# Patient Record
Sex: Male | Born: 1990 | Race: White | Hispanic: No | Marital: Single | Smoking: Never smoker
Health system: Southern US, Community
[De-identification: ages and names within clinical notes are randomized; demographics above are authoritative.]

## PROBLEM LIST (undated history)

## (undated) HISTORY — PX: HERNIA REPAIR: SHX51

---

## 2013-12-16 ENCOUNTER — Emergency Department (HOSPITAL_COMMUNITY)
Admission: EM | Admit: 2013-12-16 | Discharge: 2013-12-16 | Disposition: A | Discharge status: Home / Self Care | Payer: Self-pay | Attending: Emergency Medicine | Admitting: Emergency Medicine

## 2013-12-16 ENCOUNTER — Emergency Department (HOSPITAL_COMMUNITY): Payer: No Typology Code available for payment source

## 2013-12-16 ENCOUNTER — Emergency Department (HOSPITAL_COMMUNITY): Payer: Self-pay

## 2013-12-16 ENCOUNTER — Encounter (HOSPITAL_COMMUNITY): Payer: Self-pay | Admitting: Emergency Medicine

## 2013-12-16 DIAGNOSIS — S99919A Unspecified injury of unspecified ankle, initial encounter: Secondary | ICD-10-CM

## 2013-12-16 DIAGNOSIS — M25561 Pain in right knee: Secondary | ICD-10-CM

## 2013-12-16 DIAGNOSIS — S298XXA Other specified injuries of thorax, initial encounter: Secondary | ICD-10-CM | POA: Insufficient documentation

## 2013-12-16 DIAGNOSIS — IMO0002 Reserved for concepts with insufficient information to code with codable children: Secondary | ICD-10-CM | POA: Insufficient documentation

## 2013-12-16 DIAGNOSIS — S8990XA Unspecified injury of unspecified lower leg, initial encounter: Secondary | ICD-10-CM | POA: Insufficient documentation

## 2013-12-16 DIAGNOSIS — S99929A Unspecified injury of unspecified foot, initial encounter: Secondary | ICD-10-CM

## 2013-12-16 DIAGNOSIS — R079 Chest pain, unspecified: Secondary | ICD-10-CM

## 2013-12-16 DIAGNOSIS — R110 Nausea: Secondary | ICD-10-CM | POA: Insufficient documentation

## 2013-12-16 DIAGNOSIS — Y92410 Unspecified street and highway as the place of occurrence of the external cause: Secondary | ICD-10-CM | POA: Insufficient documentation

## 2013-12-16 DIAGNOSIS — S0990XA Unspecified injury of head, initial encounter: Secondary | ICD-10-CM | POA: Insufficient documentation

## 2013-12-16 DIAGNOSIS — Y9389 Activity, other specified: Secondary | ICD-10-CM | POA: Insufficient documentation

## 2013-12-16 DIAGNOSIS — T148XXA Other injury of unspecified body region, initial encounter: Secondary | ICD-10-CM

## 2013-12-16 DIAGNOSIS — S0510XA Contusion of eyeball and orbital tissues, unspecified eye, initial encounter: Secondary | ICD-10-CM | POA: Insufficient documentation

## 2013-12-16 LAB — I-STAT CHEM 8, ED
BUN: 9 mg/dL (ref 6–23)
BUN: 9 mg/dL (ref 6–23)
CALCIUM ION: 1.18 mmol/L (ref 1.12–1.23)
CREATININE: 1.1 mg/dL (ref 0.50–1.35)
Calcium, Ion: 1.15 mmol/L (ref 1.12–1.23)
Chloride: 104 mEq/L (ref 96–112)
Chloride: 103 mEq/L (ref 96–112)
Creatinine, Ser: 1.2 mg/dL (ref 0.50–1.35)
GLUCOSE: 106 mg/dL — AB (ref 70–99)
Glucose, Bld: 112 mg/dL — ABNORMAL HIGH (ref 70–99)
HEMATOCRIT: 48 % (ref 39.0–52.0)
HEMATOCRIT: 49 % (ref 39.0–52.0)
HEMOGLOBIN: 16.3 g/dL (ref 13.0–17.0)
HEMOGLOBIN: 16.7 g/dL (ref 13.0–17.0)
Potassium: 3.5 mEq/L — ABNORMAL LOW (ref 3.7–5.3)
Potassium: 3.5 mEq/L — ABNORMAL LOW (ref 3.7–5.3)
Sodium: 139 mEq/L (ref 137–147)
Sodium: 140 mEq/L (ref 137–147)
TCO2: 24 mmol/L (ref 0–100)
TCO2: 25 mmol/L (ref 0–100)

## 2013-12-16 MED ORDER — FLUORESCEIN SODIUM 1 MG OP STRP
1.0000 | ORAL_STRIP | Freq: Once | OPHTHALMIC | Status: AC
  Administered 2013-12-16: 1 via OPHTHALMIC
  Filled 2013-12-16: qty 1

## 2013-12-16 MED ORDER — TETRACAINE HCL 0.5 % OP SOLN
1.0000 [drp] | Freq: Once | OPHTHALMIC | Status: AC
  Administered 2013-12-16: 1 [drp] via OPHTHALMIC
  Filled 2013-12-16: qty 2

## 2013-12-16 MED ORDER — MORPHINE SULFATE 4 MG/ML IJ SOLN
4.0000 mg | Freq: Once | INTRAMUSCULAR | Status: AC
  Administered 2013-12-16: 4 mg via INTRAVENOUS
  Filled 2013-12-16: qty 1

## 2013-12-16 MED ORDER — OXYCODONE-ACETAMINOPHEN 5-325 MG PO TABS
1.0000 | ORAL_TABLET | Freq: Four times a day (QID) | ORAL | Status: DC | PRN
Start: 2013-12-16 — End: 2013-12-16

## 2013-12-16 MED ORDER — IOHEXOL 300 MG/ML  SOLN
100.0000 mL | Freq: Once | INTRAMUSCULAR | Status: AC | PRN
  Administered 2013-12-16: 100 mL via INTRAVENOUS

## 2013-12-16 MED ORDER — ONDANSETRON HCL 4 MG/2ML IJ SOLN
4.0000 mg | Freq: Once | INTRAMUSCULAR | Status: DC
  Filled 2013-12-16: qty 2

## 2013-12-16 MED ORDER — ONDANSETRON HCL 4 MG PO TABS: 4.0000 mg | ORAL_TABLET | Freq: Four times a day (QID) | ORAL | Status: AC

## 2013-12-16 MED ORDER — ONDANSETRON HCL 4 MG/2ML IJ SOLN
4.0000 mg | Freq: Once | INTRAMUSCULAR | Status: AC
  Administered 2013-12-16: 4 mg via INTRAVENOUS
  Filled 2013-12-16: qty 2

## 2013-12-16 MED ORDER — OXYCODONE-ACETAMINOPHEN 5-325 MG PO TABS: 1.0000 | ORAL_TABLET | Freq: Four times a day (QID) | ORAL | Status: AC | PRN

## 2013-12-16 MED ORDER — ONDANSETRON HCL 4 MG PO TABS: 4.0000 mg | ORAL_TABLET | Freq: Four times a day (QID) | ORAL | Status: DC
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
## 2013-12-16 NOTE — ED Notes (Signed)
Patient transported to X-ray 
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
## 2013-12-17 NOTE — ED Provider Notes (Signed)
Medical screening examination/treatment/procedure(s) were conducted as a shared visit with non-physician practitioner(s) and myself.  I personally evaluated the patient during the encounter.  Please see separate associated note for evaluation and plan.    EKG Interpretation None       Gilda Crease, MD 12/17/13 [phone]

## 2015-10-19 ENCOUNTER — Emergency Department (HOSPITAL_COMMUNITY): Payer: BLUE CROSS/BLUE SHIELD

## 2015-10-19 ENCOUNTER — Emergency Department (HOSPITAL_COMMUNITY)
Admission: EM | Admit: 2015-10-19 | Discharge: 2015-10-19 | Disposition: A | Discharge status: Home / Self Care | Payer: BLUE CROSS/BLUE SHIELD | Attending: Emergency Medicine | Admitting: Emergency Medicine

## 2015-10-19 ENCOUNTER — Encounter (HOSPITAL_COMMUNITY): Payer: Self-pay | Admitting: Emergency Medicine

## 2015-10-19 DIAGNOSIS — M545 Low back pain, unspecified: Secondary | ICD-10-CM

## 2015-10-19 DIAGNOSIS — S32000A Wedge compression fracture of unspecified lumbar vertebra, initial encounter for closed fracture: Secondary | ICD-10-CM

## 2015-10-19 DIAGNOSIS — W11XXXA Fall on and from ladder, initial encounter: Secondary | ICD-10-CM | POA: Insufficient documentation

## 2015-10-19 DIAGNOSIS — Y999 Unspecified external cause status: Secondary | ICD-10-CM | POA: Insufficient documentation

## 2015-10-19 DIAGNOSIS — S0990XA Unspecified injury of head, initial encounter: Secondary | ICD-10-CM | POA: Insufficient documentation

## 2015-10-19 DIAGNOSIS — W19XXXA Unspecified fall, initial encounter: Secondary | ICD-10-CM

## 2015-10-19 DIAGNOSIS — S32010A Wedge compression fracture of first lumbar vertebra, initial encounter for closed fracture: Secondary | ICD-10-CM | POA: Diagnosis not present

## 2015-10-19 DIAGNOSIS — S3992XA Unspecified injury of lower back, initial encounter: Secondary | ICD-10-CM | POA: Diagnosis present

## 2015-10-19 DIAGNOSIS — Y939 Activity, unspecified: Secondary | ICD-10-CM | POA: Insufficient documentation

## 2015-10-19 DIAGNOSIS — Y929 Unspecified place or not applicable: Secondary | ICD-10-CM | POA: Diagnosis not present

## 2015-10-19 LAB — I-STAT CHEM 8, ED
BUN: 10 mg/dL (ref 6–20)
CALCIUM ION: 1.11 mmol/L — AB (ref 1.12–1.23)
CHLORIDE: 102 mmol/L (ref 101–111)
CREATININE: 1.2 mg/dL (ref 0.61–1.24)
GLUCOSE: 88 mg/dL (ref 65–99)
HCT: 53 % — ABNORMAL HIGH (ref 39.0–52.0)
Hemoglobin: 18 g/dL — ABNORMAL HIGH (ref 13.0–17.0)
Potassium: 3.8 mmol/L (ref 3.5–5.1)
SODIUM: 141 mmol/L (ref 135–145)
TCO2: 25 mmol/L (ref 0–100)

## 2015-10-19 LAB — CBC WITH DIFFERENTIAL/PLATELET
Basophils Absolute: 0.1 10*3/uL (ref 0.0–0.1)
Basophils Relative: 2 %
EOS ABS: 0.4 10*3/uL (ref 0.0–0.7)
Eosinophils Relative: 5 %
HCT: 53 % — ABNORMAL HIGH (ref 39.0–52.0)
HEMOGLOBIN: 18 g/dL — AB (ref 13.0–17.0)
LYMPHS ABS: 3.1 10*3/uL (ref 0.7–4.0)
LYMPHS PCT: 39 %
MCH: 30.3 pg (ref 26.0–34.0)
MCHC: 34 g/dL (ref 30.0–36.0)
MCV: 89.1 fL (ref 78.0–100.0)
MONOS PCT: 10 %
Monocytes Absolute: 0.8 10*3/uL (ref 0.1–1.0)
NEUTROS PCT: 44 %
Neutro Abs: 3.5 10*3/uL (ref 1.7–7.7)
Platelets: 254 10*3/uL (ref 150–400)
RBC: 5.95 MIL/uL — ABNORMAL HIGH (ref 4.22–5.81)
RDW: 13.3 % (ref 11.5–15.5)
WBC: 7.9 10*3/uL (ref 4.0–10.5)

## 2015-10-19 MED ORDER — IBUPROFEN 800 MG PO TABS: 800.0000 mg | ORAL_TABLET | Freq: Three times a day (TID) | ORAL | Status: AC

## 2015-10-19 MED ORDER — METOCLOPRAMIDE HCL 5 MG/ML IJ SOLN
10.0000 mg | Freq: Once | INTRAMUSCULAR | Status: AC
  Administered 2015-10-19: 10 mg via INTRAVENOUS

## 2015-10-19 MED ORDER — OXYCODONE-ACETAMINOPHEN 5-325 MG PO TABS: 1.0000 | ORAL_TABLET | ORAL | Status: AC | PRN

## 2015-10-19 MED ORDER — ONDANSETRON HCL 4 MG/2ML IJ SOLN
4.0000 mg | Freq: Once | INTRAMUSCULAR | Status: AC
  Administered 2015-10-19: 4 mg via INTRAVENOUS
  Filled 2015-10-19: qty 2

## 2015-10-19 MED ORDER — METHOCARBAMOL 500 MG PO TABS
500.0000 mg | ORAL_TABLET | Freq: Once | ORAL | Status: AC
  Administered 2015-10-19: 500 mg via ORAL
  Filled 2015-10-19: qty 1

## 2015-10-19 MED ORDER — METHOCARBAMOL 500 MG PO TABS: 500.0000 mg | ORAL_TABLET | Freq: Two times a day (BID) | ORAL | Status: AC | PRN

## 2015-10-19 MED ORDER — ONDANSETRON 4 MG PO TBDP: 4.0000 mg | ORAL_TABLET | Freq: Three times a day (TID) | ORAL | Status: AC | PRN

## 2015-10-19 MED ORDER — FENTANYL CITRATE (PF) 100 MCG/2ML IJ SOLN
50.0000 ug | INTRAMUSCULAR | Status: DC | PRN
  Administered 2015-10-19 (×4): 50 ug via INTRAVENOUS
  Filled 2015-10-19 (×4): qty 2

## 2015-10-19 MED ORDER — HYDROMORPHONE HCL 1 MG/ML IJ SOLN
1.0000 mg | Freq: Once | INTRAMUSCULAR | Status: AC
  Administered 2015-10-19: 1 mg via INTRAVENOUS
  Filled 2015-10-19: qty 1

## 2015-10-19 MED ORDER — SODIUM CHLORIDE 0.9 % IV BOLUS (SEPSIS)
1000.0000 mL | Freq: Once | INTRAVENOUS | Status: AC
  Administered 2015-10-19: 1000 mL via INTRAVENOUS

## 2015-10-19 MED ORDER — IOPAMIDOL (ISOVUE-300) INJECTION 61%
100.0000 mL | Freq: Once | INTRAVENOUS | Status: AC | PRN
  Administered 2015-10-19: 100 mL via INTRAVENOUS

## 2015-10-19 MED ORDER — IOPAMIDOL (ISOVUE-300) INJECTION 61%
INTRAVENOUS | Status: AC
  Filled 2015-10-19: qty 100
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
## 2015-10-19 NOTE — ED Provider Notes (Signed)
CSN: [id number]     Arrival date & time 10/19/15  1522 History   First MD Initiated Contact with Patient 10/19/15 1525     Chief Complaint  Patient presents with  . Fall  . Back Pain     (Consider location/radiation/quality/duration/timing/severity/associated sxs/prior Treatment) HPI  The pt is a 25 y/o male - was 20 ft up on a ladder - fell back onto his back, landing flat on L spine - hit head, no LOC - no neck pain - occurred just pta - immobilized with CC per EMS - c/o leg numbness and weakness, unable to get up 2/2 pain.  Fentanyl and zofran prehospital with some relief.  No prior surgical hx other than mesh hernia and no bony history.    History reviewed. No pertinent past medical history. Past Surgical History  Procedure Laterality Date  . Hernia repair     No family history on file. Social History  Substance Use Topics  . Smoking status: Never Smoker   . Smokeless tobacco: Never Used  . Alcohol Use: 3.0 oz/week    5 Cans of beer per week    Review of Systems  All other systems reviewed and are negative.     Allergies  Penicillins  Home Medications   Prior to Admission medications   Medication Sig Start Date End Date Taking? Authorizing Provider  ibuprofen (ADVIL,MOTRIN) 800 MG tablet Take 1 tablet (800 mg total) by mouth 3 (three) times daily. 10/19/15   Eber Hong, MD  methocarbamol (ROBAXIN) 500 MG tablet Take 1 tablet (500 mg total) by mouth 2 (two) times daily as needed for muscle spasms. 10/19/15   Eber Hong, MD  oxyCODONE-acetaminophen (PERCOCET) 5-325 MG tablet Take 1 tablet by mouth every 4 (four) hours as needed. 10/19/15   Eber Hong, MD   BP 130/66 mmHg  Pulse 81  Temp(Src) 97.7 F (36.5 C) (Oral)  Resp 14  Ht  (1.905 m)  Wt 235 lb (106.595 kg)  BMI 29.37 kg/m2  SpO2 99% Physical Exam  Constitutional: He appears well-developed and well-nourished. No distress.  HENT:  Head: Normocephalic and atraumatic.  Mouth/Throat: Oropharynx is  clear and moist. No oropharyngeal exudate.  no facial tenderness, deformity, malocclusion or hemotympanum.  no battle's sign or racoon eyes.   Eyes: Conjunctivae and EOM are normal. Pupils are equal, round, and reactive to light. Right eye exhibits no discharge. Left eye exhibits no discharge. No scleral icterus.  Neck: Neck supple. No JVD present. No thyromegaly present.  Cardiovascular: Normal rate, regular rhythm, normal heart sounds and intact distal pulses.  Exam reveals no gallop and no friction rub.   No murmur heard. Pulmonary/Chest: Effort normal and breath sounds normal. No respiratory distress. He has no wheezes. He has no rales. He exhibits no tenderness.  Abdominal: Soft. Bowel sounds are normal. He exhibits no distension and no mass. There is tenderness ( lower abd ttp bil, mild).  Musculoskeletal: Normal range of motion. He exhibits tenderness ( ttp over the L and lower T spines as well as paraspinal ttp.  all 4 ext without deformity - joints supple diffusely, compartments soft diffusely.). He exhibits no edema.  Lymphadenopathy:    He has no cervical adenopathy.  Neurological: He is alert. Coordination normal.  UE and CN 3-12 normal. LE with dec Sensation L>R of legs, give out weakness bilaterally at quads, normal ham's, normal ankle strength to dorsi and plantarflexion.  Weakness at the L great toe - (give out) Normal reflexes at  knees and achilles bil.  Normal perineal sensation to light touch and pinprick.  Skin: Skin is warm and dry. No rash noted. No erythema.  Abrasion to L side of abd and R volar forearm  Psychiatric: He has a normal mood and affect. His behavior is normal.  Nursing note and vitals reviewed.   ED Course  Procedures (including critical care time) Labs Review Labs Reviewed  CBC WITH DIFFERENTIAL/PLATELET - Abnormal; Notable for the following:    RBC 5.95 (*)    Hemoglobin 18.0 (*)    HCT 53.0 (*)    All other components within normal limits  I-STAT  CHEM 8, ED - Abnormal; Notable for the following:    Calcium, Ion 1.11 (*)    Hemoglobin 18.0 (*)    HCT 53.0 (*)    All other components within normal limits    Imaging Review Ct Head Wo Contrast  10/19/2015  CLINICAL DATA:  Fall.  Head injury. EXAM: CT HEAD WITHOUT CONTRAST CT CERVICAL SPINE WITHOUT CONTRAST TECHNIQUE: Multidetector CT imaging of the head and cervical spine was performed following the standard protocol without intravenous contrast. Multiplanar CT image reconstructions of the cervical spine were also generated. COMPARISON:  None. FINDINGS: CT HEAD FINDINGS Ventricles are normal in size and configuration. All areas of the brain demonstrate normal gray-white matter differentiation. There is no mass, hemorrhage, edema or other evidence of acute parenchymal abnormality. No extra-axial hemorrhage. No osseous fracture or dislocation seen. CT CERVICAL SPINE FINDINGS There is mild levoscoliosis which may be positional in nature. Alignment is otherwise normal. No fracture line or displaced fracture fragment identified. Facet joints are normally aligned throughout. Paravertebral soft tissues are unremarkable. IMPRESSION: 1. Normal head CT. 2. No fracture or acute subluxation within the cervical spine. Mild levoscoliosis. Electronically Signed   By: Bary RichardStan  Maynard M.D.   On: 10/19/2015 17:02   Ct Cervical Spine Wo Contrast  10/19/2015  CLINICAL DATA:  Fall.  Head injury. EXAM: CT HEAD WITHOUT CONTRAST CT CERVICAL SPINE WITHOUT CONTRAST TECHNIQUE: Multidetector CT imaging of the head and cervical spine was performed following the standard protocol without intravenous contrast. Multiplanar CT image reconstructions of the cervical spine were also generated. COMPARISON:  None. FINDINGS: CT HEAD FINDINGS Ventricles are normal in size and configuration. All areas of the brain demonstrate normal gray-white matter differentiation. There is no mass, hemorrhage, edema or other evidence of acute parenchymal  abnormality. No extra-axial hemorrhage. No osseous fracture or dislocation seen. CT CERVICAL SPINE FINDINGS There is mild levoscoliosis which may be positional in nature. Alignment is otherwise normal. No fracture line or displaced fracture fragment identified. Facet joints are normally aligned throughout. Paravertebral soft tissues are unremarkable. IMPRESSION: 1. Normal head CT. 2. No fracture or acute subluxation within the cervical spine. Mild levoscoliosis. Electronically Signed   By: Bary RichardStan  Maynard M.D.   On: 10/19/2015 17:02   Mr Lumbar Spine Wo Contrast  10/19/2015  CLINICAL DATA:  Larey SeatFell from 20 feet with spinal fracture L1. EXAM: MRI LUMBAR SPINE WITHOUT CONTRAST TECHNIQUE: Multiplanar, multisequence MR imaging of the lumbar spine was performed. No intravenous contrast was administered. COMPARISON:  CT same day FINDINGS: Segmentation:  5 lumbar type vertebral bodies. Alignment:  Normal. Vertebrae: Acute compression fracture of L1 with loss of height anteriorly of 25%. No retropulsed bone. Conus medullaris: Extends to the L1 level and appears normal. Paraspinal and other soft tissues: At the level of the acute fracture at L1, there is a small amount of paraspinous edema/bleeding. There is  a small amount of epidural blood as well which causes mild constriction of the thecal sac but does not appear to cause neural compression. There is a small amount of ventral epidural blood extending lower in the lumbar region down to L5, non-compressive. Disc levels: Mild bulging of the T12-L1 and L1-2 discs but no traumatic disc herniation. L2-3, L3-4 and L4-5 are normal.  L5-S1 shows minor chronic bulging. IMPRESSION: Acute compression fracture at L1 with loss of height anteriorly of 25%. No retropulsed bone. Mild bulging of the T12-L1 and L1-2 disc but no traumatic disc herniation. Small amount of epidural blood at the L1 level, causing mild constriction of the thecal sac but not causing gross neural compression. Small  amount of ventral epidural blood tracking caudally, non-compressive. Electronically Signed   By: Paulina Fusi M.D.   On: 10/19/2015 19:04   Ct Abdomen Pelvis W Contrast  10/19/2015  CLINICAL DATA:  Low back pain after a fall 20 feet today. EXAM: CT ABDOMEN AND PELVIS WITH CONTRAST TECHNIQUE: Multidetector CT imaging of the abdomen and pelvis was performed using the standard protocol following bolus administration of intravenous contrast. CONTRAST:  ISOVUE-300 IOPAMIDOL (ISOVUE-300) INJECTION 61% COMPARISON:  Pelvic radiographs dated 10/19/2015 FINDINGS: Lower chest:  Normal. Hepatobiliary: Normal. Pancreas: Normal. Spleen: Normal. Adrenals/Urinary Tract: Normal. Stomach/Bowel: Normal. Vascular/Lymphatic: Normal. Reproductive: Normal. Other: No free air or free fluid. Anterior abdominal wall mesh in place. Musculoskeletal: Comminuted compression fracture of superior aspect of the L1 vertebral body. No protrusion of bone or disc material into the spinal canal. IMPRESSION: Acute compression fracture of L1. No other abnormality. Electronically Signed   By: Francene Boyers M.D.   On: 10/19/2015 16:58   Dg Pelvis Portable  10/19/2015  CLINICAL DATA:  Fall from ladder. Severe lumbar pain. Initial encounter. EXAM: PORTABLE PELVIS 1-2 VIEWS COMPARISON:  None. FINDINGS: There is no evidence of pelvic fracture or diastasis. No pelvic bone lesions are seen. IMPRESSION: Negative. Electronically Signed   By: Marnee Spring M.D.   On: 10/19/2015 15:43   Ct L-spine No Charge  10/19/2015  CLINICAL DATA:  Low back pain after falling 20 feet today. EXAM: CT LUMBAR SPINE WITHOUT CONTRAST TECHNIQUE: Multidetector CT imaging of the lumbar spine was performed without intravenous contrast administration. Multiplanar CT image reconstructions were also generated. COMPARISON:  None. FINDINGS: There is a comminuted compression fracture of the L1 vertebral body primarily involving the superior endplate in the midbody but also  extending into the inferior endplate to the right and left of midline. The fracture does not involve the pedicles or posterior elements. No protrusion of bone or disc material into the spinal canal. No visible epidural hemorrhage. There is only a small amount of paraspinal hemorrhage. IMPRESSION: Comminuted compression fracture of L1 without neural impingement. Electronically Signed   By: Francene Boyers M.D.   On: 10/19/2015 17:01   I have personally reviewed and evaluated these images and lab results as part of my medical decision-making.   EKG Interpretation None      MDM   Final diagnoses:  Fall  Lumbar compression fracture (HCC)    R/o frx / spinal cord inj or int injury given abd ttpl.  Pain meds, npno, fluids, VS stable.  4:34 PM CT shows L1 fracture - MR ordered, exam unchanged More pain meds given Family and patient updated  Dr. Newell Coral with neurosurgery consult regarding care of the patient. He suggests TLSO at all times if out of bed, bed rest for the next week, follow-up  in 2 weeks. The patient was informed of all of his results including the fracture, the small amount of bleeding and the indications for return including neurologic decline. He expresses his understanding. Family is here and is willing to help with the patient at home.  Eber HongBrian Elizbeth Posa, MD 10/19/15 2141

## 2015-10-19 NOTE — ED Notes (Signed)
Fentanyl 50 mcg wasted with Anders SimmondsEmily Bivens Charge RN

## 2015-10-19 NOTE — Progress Notes (Signed)
   10/19/15 1600  Clinical Encounter Type  Visited With Patient not available  Visit Type ED  Referral From Nurse  Consult/Referral To Chaplain  [facility] responded to level 1 and was available to contact family if needed.  No assistance needed. Rodney BoozeGail L Syd Manges 10/19/2015

## 2015-10-19 NOTE — ED Notes (Signed)
Pt arrived via [facility] reporting approx. 20 ft fall onto back.  Pt denies LOC.  Pt reports lower back pain. EMS reports giving 4 mg Zofran, 200mcg Fentanyl.  Pt AO x4.

## 2015-10-19 NOTE — ED Notes (Signed)
Pt requesting something for nausea  

## 2015-10-19 NOTE — ED Notes (Signed)
Gave pt ice water, per Lequita HaltMorgan - RN and Chrislyn - RN.

## 2015-10-19 NOTE — Discharge Instructions (Signed)
#  1 where your back brace at all times when you're getting up out of bed  #2 please use bed rest for the next 2 weeks as much as possible. If you need to get out of bed make sure you're wearing a back brace and have assistance walking.  #3 if you develop severe or worsening pain numbness or weakness he should return to the emergency Department immediately  #4 please make an appointment in the morning to be seen by the surgeon within the next 2 weeks. See the phone number above  Please obtain all of your results from medical records or have your doctors office obtain the results - share them with your doctor - you should be seen at your doctors office in the next 2 days. Call today to arrange your follow up. Take the medications as prescribed. Please review all of the medicines and only take them if you do not have an allergy to them. Please be aware that if you are taking birth control pills, taking other prescriptions, ESPECIALLY ANTIBIOTICS may make the birth control ineffective - if this is the case, either do not engage in sexual activity or use alternative methods of birth control such as condoms until you have finished the medicine and your family doctor says it is OK to restart them. If you are on a blood thinner such as COUMADIN, be aware that any other medicine that you take may cause the coumadin to either work too much, or not enough - you should have your coumadin level rechecked in next 7 days if this is the case.  ?  It is also a possibility that you have an allergic reaction to any of the medicines that you have been prescribed - Everybody reacts differently to medications and while MOST people have no trouble with most medicines, you may have a reaction such as nausea, vomiting, rash, swelling, shortness of breath. If this is the case, please stop taking the medicine immediately and contact your physician.  ?  You should return to the ER if you develop severe or worsening symptoms.

## 2015-10-19 NOTE — Progress Notes (Signed)
Orthopedic Tech Progress Note Patient Details:  Daniel Rocha Jan 08, 1991 [id number] Called [facility] for TLSO order. Patient ID: Daniel Rocha, male   DOB: Jan 08, 1991, 25 y.o.   MRN: [id number]   Lesle ChrisGilliland, Deborah Dondero L 10/19/2015, 8:00 PM

## 2015-10-19 NOTE — ED Notes (Signed)
Patient transported to MRI 

## 2015-10-19 NOTE — ED Notes (Signed)
Patient transported to CT 

## 2015-10-20 ENCOUNTER — Encounter (HOSPITAL_COMMUNITY): Payer: Self-pay | Admitting: Emergency Medicine

## 2017-02-26 IMAGING — CT CT HEAD W/O CM
3 of 8 series · 11 of 47 positions shown, 14 images · non-contrast
Comparison: None.

CLINICAL DATA: Fall.  Head injury.

EXAM:
CT HEAD WITHOUT CONTRAST
CT CERVICAL SPINE WITHOUT CONTRAST
TECHNIQUE: Multidetector CT imaging of the head and cervical spine was
performed following the standard protocol without intravenous
contrast. Multiplanar CT image reconstructions of the cervical spine
were also generated.

[Series 304: axial cspine · axial · 0.36mm/px · z∈[+749,+922]mm · 9 of 114 slices shown, 12 images]
[im 12/114  brain]
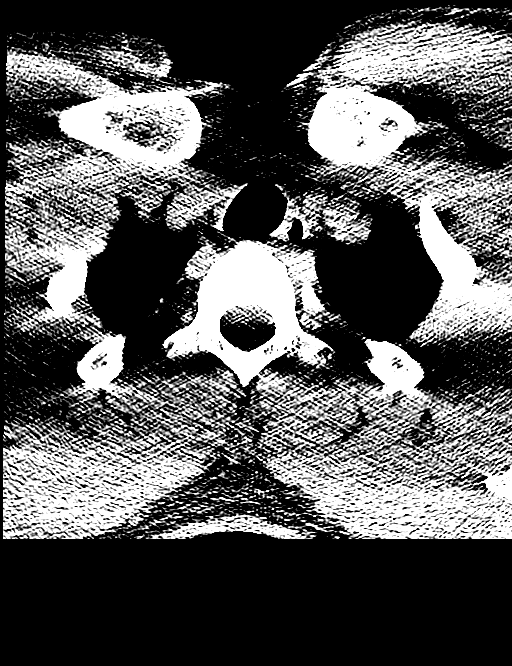
[im 12/114  bone]
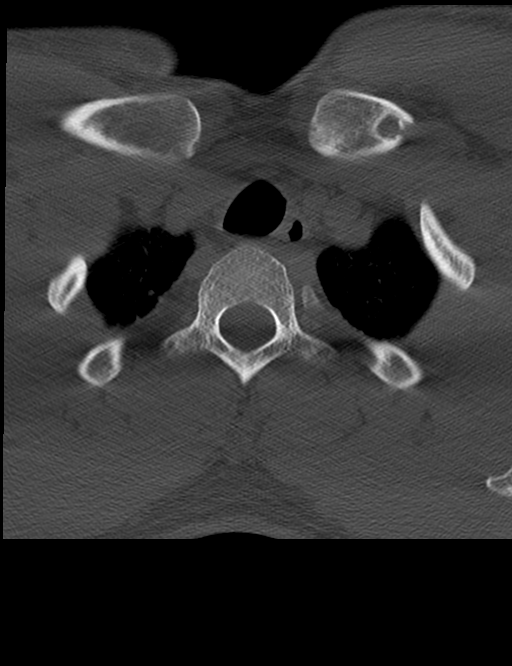
[im 23/114  brain]
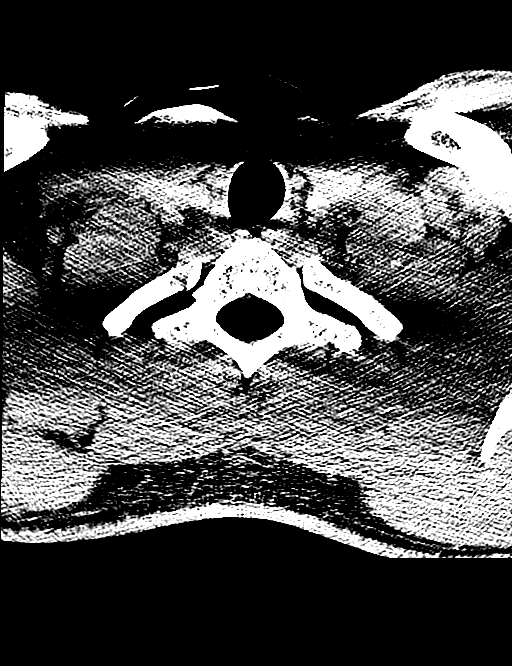
[im 34/114  brain]
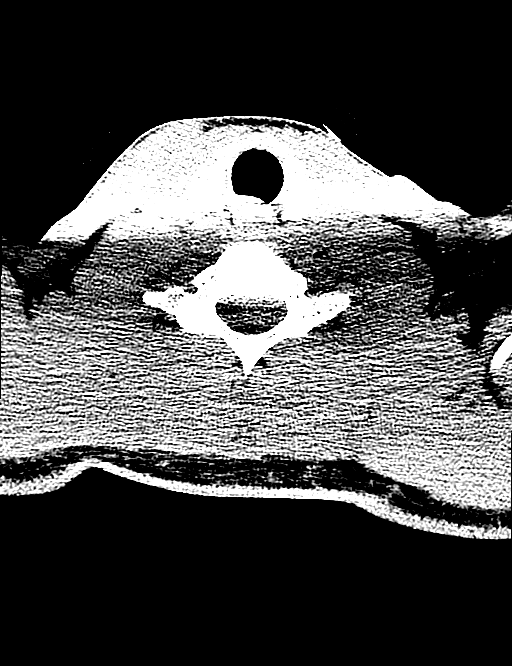
[im 46/114  brain]
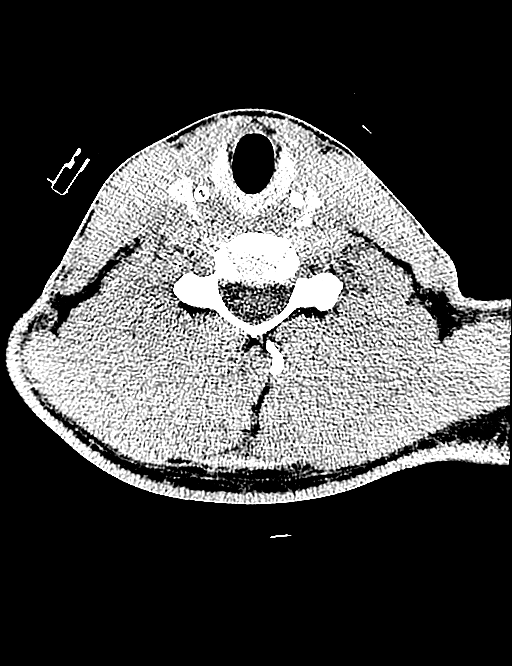
[im 57/114  brain]
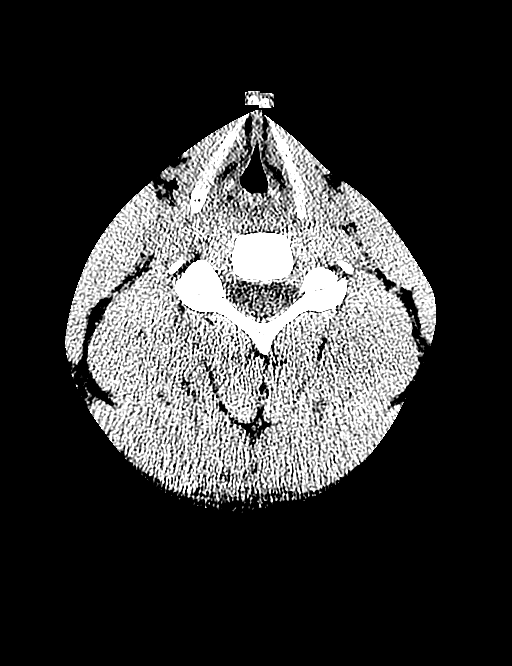
[im 57/114  bone]
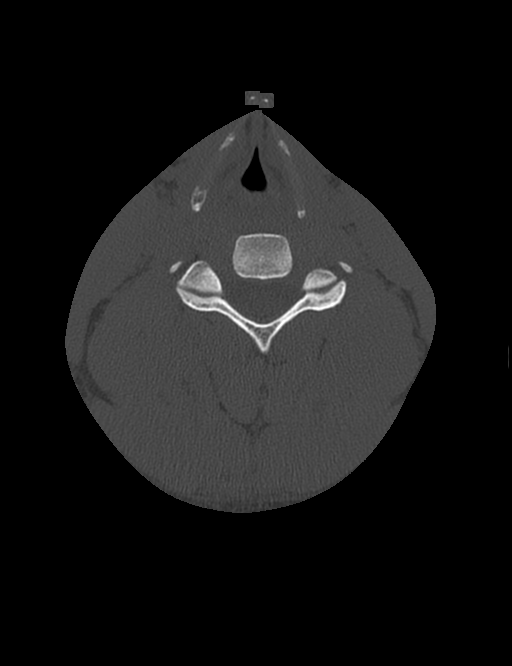
[im 68/114  brain]
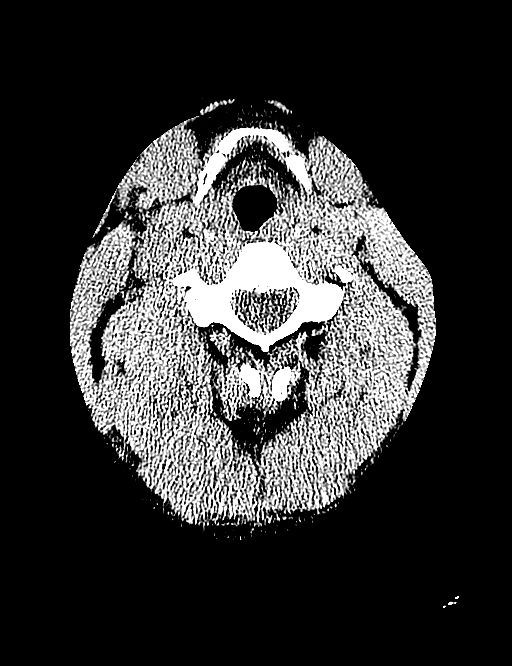
[im 80/114  brain]
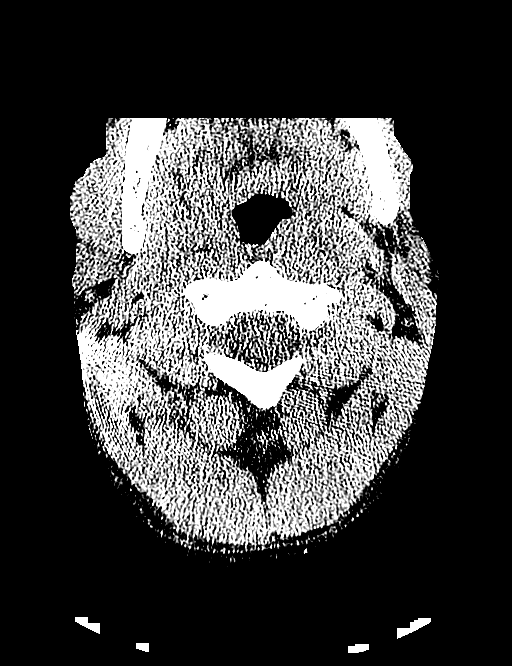
[im 91/114  brain]
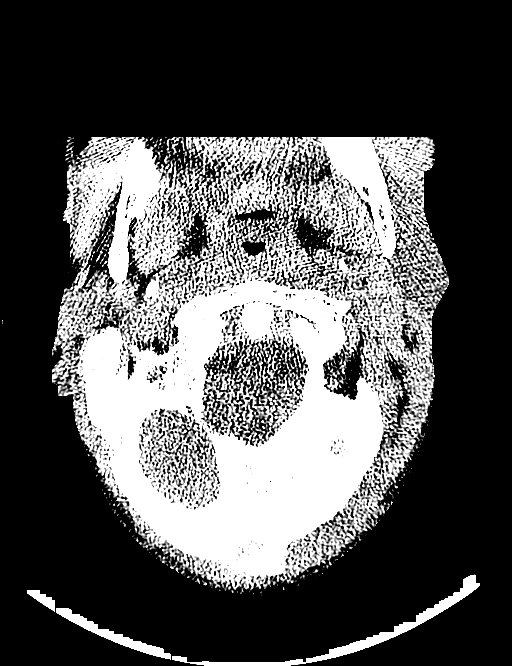
[im 102/114  brain]
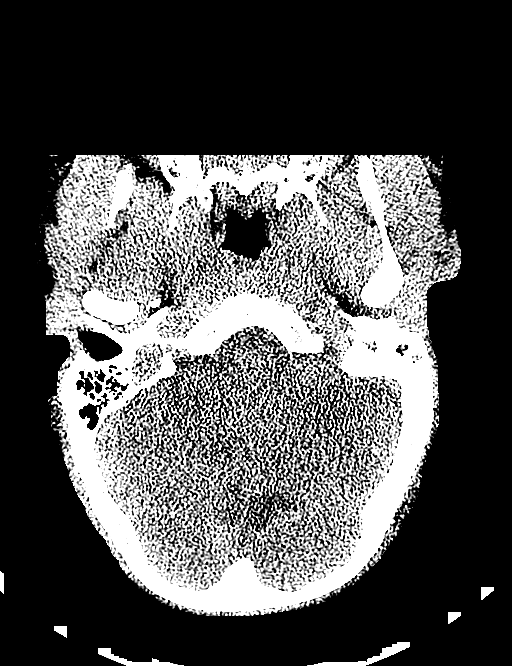
[im 102/114  bone]
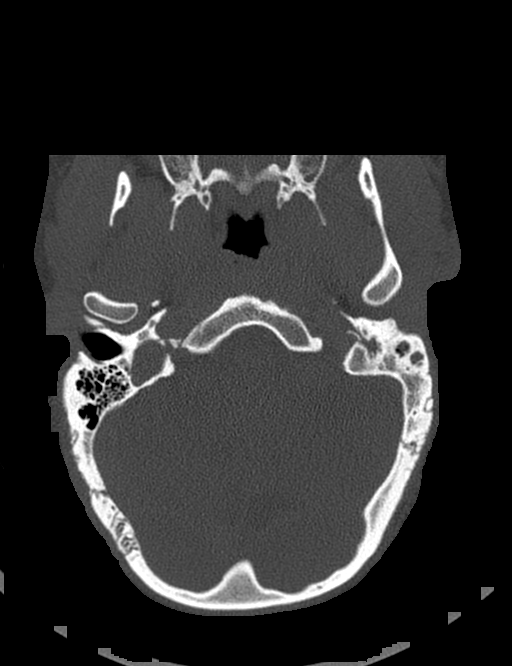

[Series 305: coronal cspine · coronal · 0.36mm/px · 1 of 44 slices shown]
[im 34/44  brain]
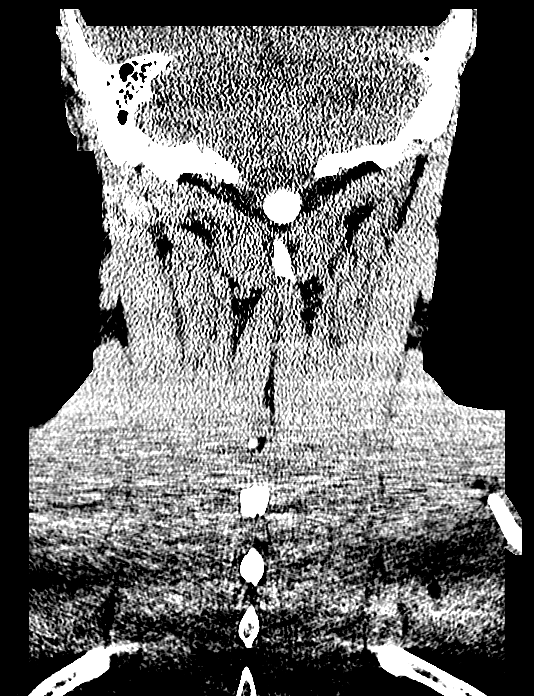

[Series 306: sagittal cspine · sagittal · 0.36mm/px · 1 of 53 slices shown]
[im 27/53  brain]
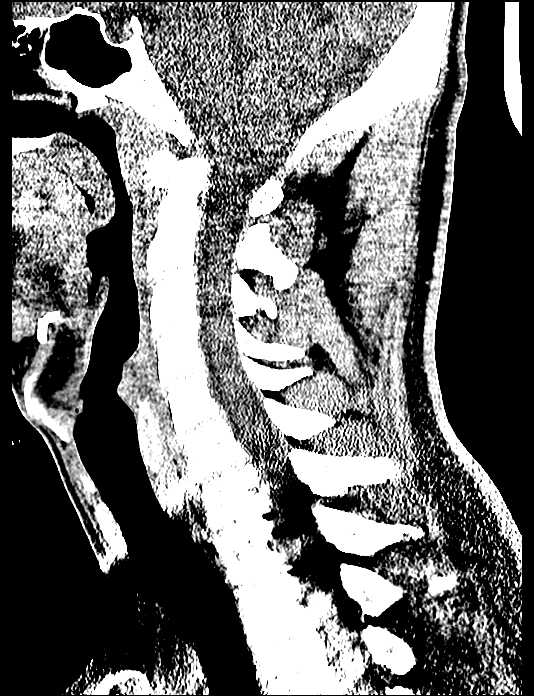

[11 of 47 positions shown; findings below may reference images not displayed]

FINDINGS: CT HEAD FINDINGS

Ventricles are normal in size and configuration. All areas of the
brain demonstrate normal gray-white matter differentiation. There is
no mass, hemorrhage, edema or other evidence of acute parenchymal
abnormality. No extra-axial hemorrhage. No osseous fracture or
dislocation seen.

CT CERVICAL SPINE FINDINGS

There is mild levoscoliosis which may be positional in nature.
Alignment is otherwise normal. No fracture line or displaced
fracture fragment identified. Facet joints are normally aligned
throughout. Paravertebral soft tissues are unremarkable.
IMPRESSION: 1. Normal head CT.
2. No fracture or acute subluxation within the cervical spine. Mild
levoscoliosis.
